# Patient Record
Sex: Male | Born: 1960 | Race: White | Hispanic: No | Marital: Married | Smoking: Never smoker
Health system: Southern US, Community
[De-identification: ages and names within clinical notes are randomized; demographics above are authoritative.]

## PROBLEM LIST (undated history)

## (undated) DIAGNOSIS — Z8619 Personal history of other infectious and parasitic diseases: Secondary | ICD-10-CM

## (undated) DIAGNOSIS — Z87442 Personal history of urinary calculi: Secondary | ICD-10-CM

## (undated) HISTORY — DX: Personal history of urinary calculi: Z87.442

## (undated) HISTORY — DX: Personal history of other infectious and parasitic diseases: Z86.19

---

## 2016-04-03 ENCOUNTER — Ambulatory Visit (INDEPENDENT_AMBULATORY_CARE_PROVIDER_SITE_OTHER): Payer: 59 | Admitting: Primary Care

## 2016-04-03 ENCOUNTER — Encounter: Payer: Self-pay | Admitting: Primary Care

## 2016-04-03 VITALS — BP 148/82 | HR 84 | Temp 98.4°F | Ht 64.5 in | Wt 159.2 lb

## 2016-04-03 DIAGNOSIS — L729 Follicular cyst of the skin and subcutaneous tissue, unspecified: Secondary | ICD-10-CM

## 2016-04-03 DIAGNOSIS — L089 Local infection of the skin and subcutaneous tissue, unspecified: Secondary | ICD-10-CM

## 2016-04-03 MED ORDER — SULFAMETHOXAZOLE-TRIMETHOPRIM 800-160 MG PO TABS: 1.0000 | ORAL_TABLET | Freq: Two times a day (BID) | ORAL | Status: AC

## 2016-04-03 NOTE — Patient Instructions (Signed)
Start Bactrim DS (sulfamethoxazole/trimethoprim) tablets for urinary tract infection. Take 1 tablet by mouth twice daily for 10 days.  Apply warm compresses to the cyst 2-3 times daily.   Do not lance the cyst.  Please notify me if no improvement in the redness and tenderness of the cyst in 3-4 days.  Please schedule a physical with me within the next 3-6 months. You may also schedule a lab only appointment 3-4 days prior. We will discuss your lab results in detail during your physical.  It was a pleasure to meet you today! Please don't hesitate to call me with any questions. Welcome to [facility]!   Epidermal Cyst An epidermal cyst is sometimes called a sebaceous cyst, epidermal inclusion cyst, or infundibular cyst. These cysts usually contain a substance that looks "pasty" or "cheesy" and may have a bad smell. This substance is a protein called keratin. Epidermal cysts are usually found on the face, neck, or trunk. They may also occur in the vaginal area or other parts of the genitalia of both men and women. Epidermal cysts are usually small, painless, slow-growing bumps or lumps that move freely under the skin. It is important not to try to pop them. This may cause an infection and lead to tenderness and swelling. CAUSES  Epidermal cysts may be caused by a deep penetrating injury to the skin or a plugged hair follicle, often associated with acne. SYMPTOMS  Epidermal cysts can become inflamed and cause:  Redness.  Tenderness.  Increased temperature of the skin over the bumps or lumps.  Grayish-white, bad smelling material that drains from the bump or lump. DIAGNOSIS  Epidermal cysts are easily diagnosed by your caregiver during an exam. Rarely, a tissue sample (biopsy) may be taken to rule out other conditions that may resemble epidermal cysts. TREATMENT   Epidermal cysts often get better and disappear on their own. They are rarely ever cancerous.  If a cyst becomes infected, it may  become inflamed and tender. This may require opening and draining the cyst. Treatment with antibiotics may be necessary. When the infection is gone, the cyst may be removed with minor surgery.  Small, inflamed cysts can often be treated with antibiotics or by injecting steroid medicines.  Sometimes, epidermal cysts become large and bothersome. If this happens, surgical removal in your caregiver's office may be necessary. HOME CARE INSTRUCTIONS  Only take over-the-counter or prescription medicines as directed by your caregiver.  Take your antibiotics as directed. Finish them even if you start to feel better. SEEK MEDICAL CARE IF:   Your cyst becomes tender, red, or swollen.  Your condition is not improving or is getting worse.  You have any other questions or concerns. MAKE SURE YOU:  Understand these instructions.  Will watch your condition.  Will get help right away if you are not doing well or get worse.   This information is not intended to replace advice given to you by your health care provider. Make sure you discuss any questions you have with your health care provider.   Document Released: 09/20/2004 Document Revised: 01/12/2012 Document Reviewed: 04/28/2011 Elsevier Interactive Patient Education [employer]

## 2016-04-03 NOTE — Progress Notes (Signed)
Pre visit review using our clinic review tool, if applicable. No additional management support is needed unless otherwise documented below in the visit note. 

## 2016-04-03 NOTE — Progress Notes (Signed)
Subjective:    Patient ID: Zachary Christensen, male    DOB: [birth year], 55 y.o.   MRN: [id number]  HPI  Mr. Zachary Christensen is a 55 year old male who presents today to establish care and discuss the problems mentioned below. Will obtain old records. His last physical was 5-6 years ago.   1) Skin Mass: Located to right upper portion of the lateral neck. He initially noticed the mass 1 year ago which was non tender, skin toned, and developed to the size of a "pea". This mass dissipated after several months.    Around January/February 2017 he noticed the mass resurface. Since it started to resurface it's gradually grown in size. He reports tenderness, erythema, and mild itching that has been present over the past several days. Denies fevers, chills, weakness.  Review of Systems  Constitutional: Negative for fever and fatigue.  HENT: Negative for congestion, rhinorrhea and sore throat.   Respiratory: Negative for shortness of breath.   Cardiovascular: Negative for chest pain.  Musculoskeletal: Negative for myalgias and arthralgias.  Skin: Positive for color change.       Skin mass  Allergic/Immunologic: Negative for environmental allergies.  Neurological: Negative for dizziness, weakness, numbness and headaches.       Past Medical History  Diagnosis Date  . History of chickenpox   . History of kidney stones      Social History   Social History  . Marital Status: Married    Spouse Name: N/A  . Number of Children: N/A  . Years of Education: N/A   Occupational History  . Not on file.   Social History Main Topics  . Smoking status: Never Smoker   . Smokeless tobacco: Not on file  . Alcohol Use: No  . Drug Use: No  . Sexual Activity: Not on file   Other Topics Concern  . Not on file   Social History Narrative   Married.   Works as an Environmental manager.   3 daughters.   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    No past  surgical history on file.  Family History  Problem Relation Age of Onset  . Heart disease Father   . Arthritis Mother     No Known Allergies  No current outpatient prescriptions on file prior to visit.   No current facility-administered medications on file prior to visit.    BP 148/82 mmHg  Pulse 84  Temp(Src) 98.4 F (36.9 C) (Oral)  Ht 5' 4.5" (1.638 m)  Wt 159 lb 4 oz (72.235 kg)  BMI 26.92 kg/m2  SpO2 97%    Objective:   Physical Exam  Constitutional: He is oriented to person, place, and time. He appears well-nourished.  Neck: Neck supple.  Cardiovascular: Normal rate and regular rhythm.   Pulmonary/Chest: Effort normal and breath sounds normal. He has no wheezes. He has no rales.  Neurological: He is alert and oriented to person, place, and time.  Skin: Skin is warm and dry. There is erythema.  2 cm in diameter, raised, firm, immobile, erythematous mass to right upper neck line just distal to right ear. Non fluctuant.  Psychiatric: He has a normal mood and affect.          Assessment & Plan:  Infected Cyst:  Mass that represents an epidermal cyst to right lateral neck line. Does not represent an abscess, however, the cyst does appear infected. Discussed etiology of various cysts and that  they will often change in size. Will initiate antibiotic treatment today as this one does appear acutely infected. Rx for bactrim DS tablets sent to pharmacy. Warm compresses, ibuprofen PRN pain.  Patient and wife to call if no reduction in erythema, tenderness, size.

## 2016-04-11 ENCOUNTER — Telehealth: Payer: Self-pay

## 2016-04-11 ENCOUNTER — Other Ambulatory Visit: Payer: Self-pay | Admitting: Primary Care

## 2016-04-11 DIAGNOSIS — R221 Localized swelling, mass and lump, neck: Secondary | ICD-10-CM

## 2016-04-11 NOTE — Telephone Encounter (Signed)
Wilhelmenia Blase (DPR signed) left v/m that pt was seen 04/03/16 and cyst on neck is not hurting now but there is no other improvement; cyst is large and wants to know if needs to be seen or can abx be changed. pt taking abx bid. Wilhelmenia Blase request cb.

## 2016-04-11 NOTE — Telephone Encounter (Signed)
I think we need to proceed with an ultrasound to get a better idea of what's going on. If he is agreeable, then I will order. Someone will be in tough with them soon in regards to scheduling if they chose to move forward.

## 2016-04-11 NOTE — Telephone Encounter (Signed)
Spoke with Martie Lee, she said that Pt is agreeable to proceed with an [place].

## 2016-04-17 ENCOUNTER — Other Ambulatory Visit: Payer: Self-pay | Admitting: Primary Care

## 2016-04-17 ENCOUNTER — Ambulatory Visit
Admission: RE | Admit: 2016-04-17 | Discharge: 2016-04-17 | Disposition: A | Discharge status: Home / Self Care | Payer: 59 | Source: Ambulatory Visit | Attending: Primary Care | Admitting: Primary Care

## 2016-04-17 DIAGNOSIS — R221 Localized swelling, mass and lump, neck: Secondary | ICD-10-CM

## 2018-08-21 ENCOUNTER — Emergency Department (HOSPITAL_COMMUNITY): Admit: 2018-08-21 | Payer: Self-pay | Admitting: Cardiovascular Disease

## 2018-08-21 ENCOUNTER — Inpatient Hospital Stay (HOSPITAL_COMMUNITY): Payer: Self-pay

## 2018-08-21 ENCOUNTER — Inpatient Hospital Stay (HOSPITAL_COMMUNITY): Admission: EM | Disposition: E | Payer: Self-pay | Source: Home / Self Care | Attending: Critical Care Medicine

## 2018-08-21 ENCOUNTER — Emergency Department (HOSPITAL_COMMUNITY): Payer: Self-pay

## 2018-08-21 ENCOUNTER — Other Ambulatory Visit: Payer: Self-pay

## 2018-08-21 ENCOUNTER — Encounter (HOSPITAL_COMMUNITY): Payer: Self-pay | Admitting: Anesthesiology

## 2018-08-21 ENCOUNTER — Encounter (HOSPITAL_COMMUNITY): Payer: Self-pay

## 2018-08-21 DIAGNOSIS — G931 Anoxic brain damage, not elsewhere classified: Secondary | ICD-10-CM | POA: Diagnosis present

## 2018-08-21 DIAGNOSIS — N179 Acute kidney failure, unspecified: Secondary | ICD-10-CM | POA: Diagnosis present

## 2018-08-21 DIAGNOSIS — I2119 ST elevation (STEMI) myocardial infarction involving other coronary artery of inferior wall: Principal | ICD-10-CM | POA: Diagnosis present

## 2018-08-21 DIAGNOSIS — I4901 Ventricular fibrillation: Secondary | ICD-10-CM | POA: Diagnosis present

## 2018-08-21 DIAGNOSIS — I462 Cardiac arrest due to underlying cardiac condition: Secondary | ICD-10-CM | POA: Diagnosis present

## 2018-08-21 DIAGNOSIS — Z8619 Personal history of other infectious and parasitic diseases: Secondary | ICD-10-CM

## 2018-08-21 DIAGNOSIS — T884XXA Failed or difficult intubation, initial encounter: Secondary | ICD-10-CM | POA: Diagnosis present

## 2018-08-21 DIAGNOSIS — Z66 Do not resuscitate: Secondary | ICD-10-CM | POA: Diagnosis not present

## 2018-08-21 DIAGNOSIS — E872 Acidosis: Secondary | ICD-10-CM | POA: Diagnosis present

## 2018-08-21 DIAGNOSIS — J9601 Acute respiratory failure with hypoxia: Secondary | ICD-10-CM | POA: Diagnosis present

## 2018-08-21 DIAGNOSIS — I469 Cardiac arrest, cause unspecified: Secondary | ICD-10-CM

## 2018-08-21 DIAGNOSIS — I501 Left ventricular failure: Secondary | ICD-10-CM

## 2018-08-21 DIAGNOSIS — R570 Cardiogenic shock: Secondary | ICD-10-CM

## 2018-08-21 DIAGNOSIS — Z87442 Personal history of urinary calculi: Secondary | ICD-10-CM

## 2018-08-21 DIAGNOSIS — J811 Chronic pulmonary edema: Secondary | ICD-10-CM | POA: Diagnosis present

## 2018-08-21 DIAGNOSIS — I213 ST elevation (STEMI) myocardial infarction of unspecified site: Secondary | ICD-10-CM

## 2018-08-21 DIAGNOSIS — J9602 Acute respiratory failure with hypercapnia: Secondary | ICD-10-CM

## 2018-08-21 DIAGNOSIS — Z8249 Family history of ischemic heart disease and other diseases of the circulatory system: Secondary | ICD-10-CM

## 2018-08-21 DIAGNOSIS — I493 Ventricular premature depolarization: Secondary | ICD-10-CM | POA: Diagnosis present

## 2018-08-21 DIAGNOSIS — R9431 Abnormal electrocardiogram [ECG] [EKG]: Secondary | ICD-10-CM

## 2018-08-21 LAB — I-STAT ARTERIAL BLOOD GAS, ED
ACID-BASE DEFICIT: 14 mmol/L — AB (ref 0.0–2.0)
Acid-base deficit: 7 mmol/L — ABNORMAL HIGH (ref 0.0–2.0)
Bicarbonate: 15.4 mmol/L — ABNORMAL LOW (ref 20.0–28.0)
Bicarbonate: 25.4 mmol/L (ref 20.0–28.0)
O2 SAT: 80 %
O2 SAT: 64 %
PCO2 ART: 80.3 mmHg — AB (ref 32.0–48.0)
PH ART: 7.108 — AB (ref 7.350–7.450)
PO2 ART: 35 mmHg — AB (ref 83.0–108.0)
Patient temperature: 85.6
Patient temperature: 36.8
TCO2: 17 mmol/L — ABNORMAL LOW (ref 22–32)
TCO2: 28 mmol/L (ref 22–32)
pCO2 arterial: 34.7 mmHg (ref 32.0–48.0)
pH, Arterial: 7.21 — ABNORMAL LOW (ref 7.350–7.450)
pO2, Arterial: 46 mmHg — ABNORMAL LOW (ref 83.0–108.0)

## 2018-08-21 LAB — COMPREHENSIVE METABOLIC PANEL
ALBUMIN: 3.4 g/dL — AB (ref 3.5–5.0)
ALT: 145 U/L — ABNORMAL HIGH (ref 0–44)
ANION GAP: 20 — AB (ref 5–15)
AST: 175 U/L — ABNORMAL HIGH (ref 15–41)
Alkaline Phosphatase: 69 U/L (ref 38–126)
BILIRUBIN TOTAL: 0.7 mg/dL (ref 0.3–1.2)
BUN: 16 mg/dL (ref 6–20)
CALCIUM: 9.2 mg/dL (ref 8.9–10.3)
CO2: 16 mmol/L — ABNORMAL LOW (ref 22–32)
Chloride: 102 mmol/L (ref 98–111)
Creatinine, Ser: 1.77 mg/dL — ABNORMAL HIGH (ref 0.61–1.24)
GFR calc Af Amer: 47 mL/min — ABNORMAL LOW (ref >60)
GFR, EST NON AFRICAN AMERICAN: 41 mL/min — AB (ref >60)
GLUCOSE: 275 mg/dL — AB (ref 70–99)
Potassium: 3.6 mmol/L (ref 3.5–5.1)
Sodium: 138 mmol/L (ref 135–145)
TOTAL PROTEIN: 6.7 g/dL (ref 6.5–8.1)

## 2018-08-21 LAB — CBC WITH DIFFERENTIAL/PLATELET
Abs Immature Granulocytes: 0.41 10*3/uL — ABNORMAL HIGH (ref 0.00–0.07)
BASOS ABS: 0.2 10*3/uL — AB (ref 0.0–0.1)
BASOS PCT: 1 %
EOS ABS: 0.5 10*3/uL (ref 0.0–0.5)
EOS PCT: 2 %
HCT: 54.2 % — ABNORMAL HIGH (ref 39.0–52.0)
HEMOGLOBIN: 17.4 g/dL — AB (ref 13.0–17.0)
Immature Granulocytes: 2 %
Lymphocytes Relative: 32 %
Lymphs Abs: 6.5 10*3/uL — ABNORMAL HIGH (ref 0.7–4.0)
MCH: 28.6 pg (ref 26.0–34.0)
MCHC: 32.1 g/dL (ref 30.0–36.0)
MCV: 89.1 fL (ref 80.0–100.0)
MONO ABS: 1.5 10*3/uL — AB (ref 0.1–1.0)
Monocytes Relative: 8 %
NRBC: 0 % (ref 0.0–0.2)
Neutro Abs: 11.3 10*3/uL — ABNORMAL HIGH (ref 1.7–7.7)
Neutrophils Relative %: 55 %
PLATELETS: 374 10*3/uL (ref 150–400)
RBC: 6.08 MIL/uL — AB (ref 4.22–5.81)
RDW: 12.2 % (ref 11.5–15.5)
WBC: 20.3 10*3/uL — AB (ref 4.0–10.5)

## 2018-08-21 LAB — I-STAT CHEM 8, ED
BUN: 20 mg/dL (ref 6–20)
CHLORIDE: 103 mmol/L (ref 98–111)
Calcium, Ion: 1.1 mmol/L — ABNORMAL LOW (ref 1.15–1.40)
Creatinine, Ser: 1.6 mg/dL — ABNORMAL HIGH (ref 0.61–1.24)
Glucose, Bld: 272 mg/dL — ABNORMAL HIGH (ref 70–99)
HCT: 53 % — ABNORMAL HIGH (ref 39.0–52.0)
Hemoglobin: 18 g/dL — ABNORMAL HIGH (ref 13.0–17.0)
POTASSIUM: 3.4 mmol/L — AB (ref 3.5–5.1)
SODIUM: 138 mmol/L (ref 135–145)
TCO2: 19 mmol/L — ABNORMAL LOW (ref 22–32)

## 2018-08-21 LAB — I-STAT TROPONIN, ED: Troponin i, poc: 0.76 ng/mL (ref 0.00–0.08)

## 2018-08-21 LAB — LIPID PANEL
Cholesterol: 206 mg/dL — ABNORMAL HIGH (ref 0–200)
HDL: 48 mg/dL (ref >40)
LDL CALC: 136 mg/dL — AB (ref 0–99)
TRIGLYCERIDES: 112 mg/dL (ref <150)
Total CHOL/HDL Ratio: 4.3 RATIO
VLDL: 22 mg/dL (ref 0–40)

## 2018-08-21 LAB — I-STAT CG4 LACTIC ACID, ED: LACTIC ACID, VENOUS: 9.49 mmol/L — AB (ref 0.5–1.9)

## 2018-08-21 LAB — TROPONIN I
TROPONIN I: 1.19 ng/mL — AB (ref <0.03)
Troponin I: 8.46 ng/mL (ref <0.03)

## 2018-08-21 LAB — PROTIME-INR
INR: 1.07
PROTHROMBIN TIME: 13.8 s (ref 11.4–15.2)

## 2018-08-21 LAB — LACTIC ACID, PLASMA: LACTIC ACID, VENOUS: 8.7 mmol/L — AB (ref 0.5–1.9)

## 2018-08-21 LAB — APTT: aPTT: 30 seconds (ref 24–36)

## 2018-08-21 SURGERY — CORONARY/GRAFT ACUTE MI REVASCULARIZATION
Anesthesia: LOCAL

## 2018-08-21 MED ORDER — EPINEPHRINE PF 1 MG/10ML IJ SOSY
PREFILLED_SYRINGE | INTRAMUSCULAR | Status: AC | PRN
  Administered 2018-08-21 (×3): 1 via INTRAVENOUS

## 2018-08-21 MED ORDER — SUCCINYLCHOLINE CHLORIDE 20 MG/ML IJ SOLN
INTRAMUSCULAR | Status: AC | PRN
  Administered 2018-08-21: 150 mg via INTRAVENOUS

## 2018-08-21 MED ORDER — VERAPAMIL HCL 2.5 MG/ML IV SOLN
INTRAVENOUS | Status: AC
  Filled 2018-08-21: qty 2

## 2018-08-21 MED ORDER — ETOMIDATE 2 MG/ML IV SOLN
INTRAVENOUS | Status: AC | PRN
  Administered 2018-08-21: 20 mg via INTRAVENOUS

## 2018-08-21 MED ORDER — INSULIN ASPART 100 UNIT/ML ~~LOC~~ SOLN: 2.0000 [IU] | SUBCUTANEOUS | Status: DC

## 2018-08-21 MED ORDER — HEPARIN (PORCINE) IN NACL 100-0.45 UNIT/ML-% IJ SOLN
900.0000 [IU]/h | INTRAMUSCULAR | Status: DC
  Administered 2018-08-21: 900 [IU]/h via INTRAVENOUS
  Filled 2018-08-21: qty 250

## 2018-08-21 MED ORDER — NOREPINEPHRINE 4 MG/250ML-% IV SOLN
0.0000 ug/min | INTRAVENOUS | Status: DC
  Administered 2018-08-21: 20 ug/min via INTRAVENOUS

## 2018-08-21 MED ORDER — LIDOCAINE HCL (PF) 1 % IJ SOLN
INTRAMUSCULAR | Status: AC
  Filled 2018-08-21: qty 30

## 2018-08-21 MED ORDER — VECURONIUM BROMIDE 10 MG IV SOLR
INTRAVENOUS | Status: AC | PRN
  Administered 2018-08-21: 10 mg via INTRAVENOUS

## 2018-08-21 MED ORDER — HEPARIN (PORCINE) IN NACL 1000-0.9 UT/500ML-% IV SOLN
INTRAVENOUS | Status: AC
  Filled 2018-08-21: qty 500

## 2018-08-21 MED ORDER — SODIUM BICARBONATE 8.4 % IV SOLN
INTRAVENOUS | Status: AC | PRN
  Administered 2018-08-21 (×4): 50 meq via INTRAVENOUS

## 2018-08-21 MED ORDER — NITROGLYCERIN 1 MG/10 ML FOR IR/CATH LAB
INTRA_ARTERIAL | Status: AC
  Filled 2018-08-21: qty 10

## 2018-08-21 MED ORDER — HEPARIN BOLUS VIA INFUSION
3500.0000 [IU] | Freq: Once | INTRAVENOUS | Status: AC
  Administered 2018-08-21: 3500 [IU] via INTRAVENOUS
  Filled 2018-08-21: qty 3500

## 2018-08-21 MED ORDER — SODIUM BICARBONATE 8.4 % IV SOLN
100.0000 meq | Freq: Once | INTRAVENOUS | Status: AC
  Administered 2018-08-21: 100 meq via INTRAVENOUS
  Filled 2018-08-21: qty 100

## 2018-08-21 MED ORDER — MAGNESIUM SULFATE 2 GM/50ML IV SOLN
2.0000 g | Freq: Once | INTRAVENOUS | Status: AC
  Administered 2018-08-21: 2 g via INTRAVENOUS
  Filled 2018-08-21: qty 50

## 2018-08-21 MED ORDER — FENTANYL CITRATE (PF) 100 MCG/2ML IJ SOLN: 50.0000 ug | INTRAMUSCULAR | Status: DC | PRN

## 2018-08-21 MED ORDER — FENTANYL CITRATE (PF) 100 MCG/2ML IJ SOLN
50.0000 ug | INTRAMUSCULAR | Status: DC | PRN
  Administered 2018-08-21: 50 ug via INTRAVENOUS

## 2018-08-21 MED ORDER — FENTANYL CITRATE (PF) 100 MCG/2ML IJ SOLN
INTRAMUSCULAR | Status: AC
  Filled 2018-08-21: qty 2

## 2018-08-21 MED ORDER — DEXTROSE 5 % IV SOLN
INTRAVENOUS | Status: AC | PRN
  Administered 2018-08-21: 150 mg via INTRAVENOUS

## 2018-08-21 MED ORDER — VECURONIUM BROMIDE 10 MG IV SOLR
INTRAVENOUS | Status: AC
  Filled 2018-08-21: qty 10

## 2018-08-21 MED ORDER — PANTOPRAZOLE SODIUM 40 MG PO PACK: 40.0000 mg | PACK | Freq: Every day | ORAL | Status: DC

## 2018-08-21 MED ORDER — HEPARIN (PORCINE) IN NACL 1000-0.9 UT/500ML-% IV SOLN
INTRAVENOUS | Status: AC
  Filled 2018-08-21: qty 1000

## 2018-08-21 MED ORDER — PIPERACILLIN-TAZOBACTAM 3.375 G IVPB 30 MIN
3.3750 g | Freq: Once | INTRAVENOUS | Status: AC
  Administered 2018-08-21: 3.375 g via INTRAVENOUS
  Filled 2018-08-21: qty 50

## 2018-08-21 MED ORDER — SODIUM CHLORIDE 0.9 % IV SOLN: INTRAVENOUS | Status: DC | PRN

## 2018-08-21 MED ORDER — HEPARIN SODIUM (PORCINE) 5000 UNIT/ML IJ SOLN: 5000.0000 [IU] | Freq: Three times a day (TID) | INTRAMUSCULAR | Status: DC

## 2018-08-21 MED ORDER — POTASSIUM CHLORIDE 10 MEQ/100ML IV SOLN
10.0000 meq | INTRAVENOUS | Status: AC
  Administered 2018-08-21 (×2): 10 meq via INTRAVENOUS
  Filled 2018-08-21 (×2): qty 100

## 2018-08-21 MED FILL — Medication: Qty: 1 | Status: AC

## 2018-08-22 LAB — BLOOD CULTURE ID PANEL (REFLEXED)
ACINETOBACTER BAUMANNII: NOT DETECTED
CANDIDA ALBICANS: NOT DETECTED
CANDIDA GLABRATA: NOT DETECTED
CANDIDA KRUSEI: NOT DETECTED
CANDIDA TROPICALIS: NOT DETECTED
Candida parapsilosis: NOT DETECTED
ENTEROBACTERIACEAE SPECIES: NOT DETECTED
ESCHERICHIA COLI: NOT DETECTED
Enterobacter cloacae complex: NOT DETECTED
Enterococcus species: NOT DETECTED
Haemophilus influenzae: NOT DETECTED
KLEBSIELLA OXYTOCA: NOT DETECTED
KLEBSIELLA PNEUMONIAE: NOT DETECTED
Listeria monocytogenes: NOT DETECTED
Neisseria meningitidis: NOT DETECTED
Proteus species: NOT DETECTED
Pseudomonas aeruginosa: NOT DETECTED
STREPTOCOCCUS PYOGENES: NOT DETECTED
Serratia marcescens: NOT DETECTED
Staphylococcus aureus (BCID): NOT DETECTED
Staphylococcus species: NOT DETECTED
Streptococcus agalactiae: NOT DETECTED
Streptococcus pneumoniae: NOT DETECTED
Streptococcus species: DETECTED — AB

## 2018-08-23 ENCOUNTER — Telehealth: Payer: Self-pay

## 2018-08-23 NOTE — Telephone Encounter (Signed)
On 08/23/18 I received a d/c from [facility] (original). D/C is for cremation. D/C will be taken to Pulmonary Unit for signature.  On 08/24/18 I received the d/c back from Doctor Craige Cotta,  I got the d/c ready and called the funeral home to let them know the d/c is ready for pickup.  I also faxed a copy to the funeral home per the funeral home request.

## 2018-08-24 LAB — CULTURE, BLOOD (ROUTINE X 2)

## 2018-08-26 LAB — CULTURE, BLOOD (ROUTINE X 2): Culture: NO GROWTH

## 2018-09-03 NOTE — Code Documentation (Addendum)
intubation w/ 7.5 ETT tube 25@lip  by MD Delo. Per nursing staff at bedside, no color change on CO2 indi Awaiting XR confirmation.

## 2018-09-03 NOTE — Code Documentation (Signed)
Anesthesia paged STAT for difficult airway

## 2018-09-03 NOTE — ED Provider Notes (Signed)
[facility] EMERGENCY DEPARTMENT Provider Note   CSN: [id number] Arrival date & time: 2018-09-20  0155     History   Chief Complaint Chief Complaint  Patient presents with  . Post CPR/Code STEMI    HPI Zachary Christensen is a 57 y.o. male.  This patient is a 57 year old male brought from home by EMS after his wife noticed him unresponsive.  I am told she was sleeping beside him when he began to make snoring respirations and would not respond.  She was unable to wake him and EMS was called.  I am told that he was found initially to be in ventricular fibrillation and received 2 shocks prior to transport.  He also received epinephrine and CPR in route.  A King airway was placed and the patient was transported here.  He has no additional history secondary to acuity of condition.  The history is provided by the patient.    Past Medical History:  Diagnosis Date  . History of chickenpox   . History of kidney stones     Patient Active Problem List   Diagnosis Date Noted  . Cardiac arrest (HCC) 09-20-18          Home Medications    Prior to Admission medications   Medication Sig Start Date End Date Taking? Authorizing Provider  Multiple Vitamin (MULTIVITAMIN) capsule Take 1 capsule by mouth daily.    [provider]  sulfamethoxazole-trimethoprim (BACTRIM DS,SEPTRA DS) 800-160 MG tablet Take 1 tablet by mouth 2 (two) times daily. 04/03/16   Doreene Nest, NP    Family History Family History  Problem Relation Age of Onset  . Heart disease Father   . Arthritis Mother     Social History Social History   Tobacco Use  . Smoking status: Never Smoker  Substance Use Topics  . Alcohol use: No    Alcohol/week: 0.0 standard drinks  . Drug use: No     Allergies   Patient has no known allergies.   Review of Systems Review of Systems  Unable to perform ROS: Acuity of condition     Physical Exam Updated Vital Signs BP (!) 200/145    Pulse 97   Temp 99.3 F (37.4 C)   Resp (!) 30   Ht 5\' 4"  (1.626 m)   Wt 70 kg   SpO2 (!) 61%   BMI 26.49 kg/m   Physical Exam  Constitutional: He appears well-developed and well-nourished. No distress.  HENT:  Head: Normocephalic and atraumatic.  Neck: Normal range of motion. Neck supple.  Cardiovascular: Normal rate and regular rhythm. Exam reveals no friction rub.  No murmur heard. Pulmonary/Chest:  Breath sounds are coarse bilaterally.  King airway is in place and he is being ventilated through this..  Abdominal: Soft. Bowel sounds are normal. He exhibits no distension. There is no tenderness.  Musculoskeletal: Normal range of motion. He exhibits no edema.  Neurological:  GCS is 6 with no spontaneous eye movement, no verbal response, but does seem to withdraw from pain initially.  Skin: Skin is warm and dry. He is not diaphoretic.  Nursing note and vitals reviewed.    ED Treatments / Results  Labs (all labs ordered are listed, but only abnormal results are displayed) Labs Reviewed  CBC WITH DIFFERENTIAL/PLATELET - Abnormal; Notable for the following components:      Result Value   WBC 20.3 (*)    RBC 6.08 (*)    Hemoglobin 17.4 (*)  HCT 54.2 (*)    Neutro Abs 11.3 (*)    Lymphs Abs 6.5 (*)    Monocytes Absolute 1.5 (*)    Basophils Absolute 0.2 (*)    Abs Immature Granulocytes 0.41 (*)    All other components within normal limits  COMPREHENSIVE METABOLIC PANEL - Abnormal; Notable for the following components:   CO2 16 (*)    Glucose, Bld 275 (*)    Creatinine, Ser 1.77 (*)    Albumin 3.4 (*)    AST 175 (*)    ALT 145 (*)    GFR calc non Af Amer 41 (*)    GFR calc Af Amer 47 (*)    Anion gap 20 (*)    All other components within normal limits  TROPONIN I - Abnormal; Notable for the following components:   Troponin I 1.19 (*)    All other components within normal limits  LIPID PANEL - Abnormal; Notable for the following components:   Cholesterol 206 (*)     LDL Cholesterol 136 (*)    All other components within normal limits  LACTIC ACID, PLASMA - Abnormal; Notable for the following components:   Lactic Acid, Venous 8.7 (*)    All other components within normal limits  TROPONIN I - Abnormal; Notable for the following components:   Troponin I 8.46 (*)    All other components within normal limits  I-STAT CHEM 8, ED - Abnormal; Notable for the following components:   Potassium 3.4 (*)    Creatinine, Ser 1.60 (*)    Glucose, Bld 272 (*)    Calcium, Ion 1.10 (*)    TCO2 19 (*)    Hemoglobin 18.0 (*)    HCT 53.0 (*)    All other components within normal limits  I-STAT TROPONIN, ED - Abnormal; Notable for the following components:   Troponin i, poc 0.76 (*)    All other components within normal limits  I-STAT CG4 LACTIC ACID, ED - Abnormal; Notable for the following components:   Lactic Acid, Venous 9.49 (*)    All other components within normal limits  I-STAT ARTERIAL BLOOD GAS, ED - Abnormal; Notable for the following components:   pH, Arterial 7.210 (*)    pO2, Arterial 35.0 (*)    Bicarbonate 15.4 (*)    TCO2 17 (*)    Acid-base deficit 14.0 (*)    All other components within normal limits  I-STAT ARTERIAL BLOOD GAS, ED - Abnormal; Notable for the following components:   pH, Arterial 7.108 (*)    pCO2 arterial 80.3 (*)    pO2, Arterial 46.0 (*)    Acid-base deficit 7.0 (*)    All other components within normal limits  CULTURE, BLOOD (ROUTINE X 2)  CULTURE, BLOOD (ROUTINE X 2)  CULTURE, RESPIRATORY  PROTIME-INR  APTT  URINALYSIS, ROUTINE W REFLEX MICROSCOPIC  RAPID URINE DRUG SCREEN, HOSP PERFORMED  BLOOD GAS, ARTERIAL  TROPONIN I  TROPONIN I  HEPARIN LEVEL (UNFRACTIONATED)  HIV ANTIBODY (ROUTINE TESTING W REFLEX)  HEMOGLOBIN A1C    EKG None  Radiology Dg Chest Portable 1 View  Result Date: September 09, 2018 CLINICAL DATA:  Post intubation EXAM: PORTABLE CHEST 1 VIEW COMPARISON:  None. FINDINGS: Endotracheal tube tip is  about 2.9 cm superior to the carina. Cardiomegaly with vascular congestion and mild diffuse interstitial edema. Subsegmental atelectasis at the right base. No pneumothorax. IMPRESSION: 1. Endotracheal tube tip about 2.9 cm superior to the carina 2. Cardiomegaly with vascular congestion and mild interstitial edema.  Electronically Signed   By: Jasmine Pang M.D.   On: 08/27/2018 02:51    Procedures Procedures (including critical care time)  Medications Ordered in ED Medications  norepinephrine (LEVOPHED) 4mg  in D5W premix infusion (40 mcg/min Intravenous Rate/Dose Change 08-27-2018 0456)  potassium chloride 10 mEq in 100 mL IVPB (10 mEq Intravenous New Bag/Given 2018-08-27 0501)  insulin aspart (novoLOG) injection 2-6 Units (has no administration in time range)  0.9 %  sodium chloride infusion (has no administration in time range)  heparin ADULT infusion 100 units/mL (25000 units/254mL sodium chloride 0.45%) (900 Units/hr Intravenous New Bag/Given Aug 27, 2018 0427)  pantoprazole sodium (PROTONIX) 40 mg/20 mL oral suspension 40 mg (has no administration in time range)  fentaNYL (SUBLIMAZE) injection 50 mcg (50 mcg Intravenous Given 08/27/18 0426)  fentaNYL (SUBLIMAZE) injection 50 mcg (has no administration in time range)  fentaNYL (SUBLIMAZE) 100 MCG/2ML injection (  Canceled Entry 08/27/2018 0434)  EPINEPHrine (ADRENALIN) 1 MG/10ML injection (1 Syringe Intravenous Given 2018/08/27 0456)  sodium bicarbonate injection (50 mEq Intravenous Given August 27, 2018 0500)  vecuronium (NORCURON) 10 MG injection (has no administration in time range)  vecuronium (NORCURON) injection (10 mg Intravenous Given 27-Aug-2018 0455)  etomidate (AMIDATE) injection (20 mg Intravenous Given 2018-08-27 0205)  succinylcholine (ANECTINE) injection (150 mg Intravenous Given August 27, 2018 0205)  succinylcholine (ANECTINE) injection (150 mg Intravenous Given 2018/08/27 0223)  etomidate (AMIDATE) injection (20 mg Intravenous Given 08-27-18 0223)    amiodarone (CORDARONE) 150 mg in dextrose 5 % 100 mL bolus ( Intravenous Stopped 08-27-18 0236)  sodium bicarbonate injection 100 mEq (100 mEq Intravenous Given Aug 27, 2018 0343)  magnesium sulfate IVPB 2 g 50 mL (0 g Intravenous Stopped 2018/08/27 0443)  piperacillin-tazobactam (ZOSYN) IVPB 3.375 g (0 g Intravenous Stopped 08/27/18 0502)  heparin bolus via infusion 3,500 Units (3,500 Units Intravenous Bolus from Bag 2018-08-27 0430)     Initial Impression / Assessment and Plan / ED Course  I have reviewed the triage vital signs and the nursing notes.  Pertinent labs & imaging results that were available during my care of the patient were reviewed by me and considered in my medical decision making (see chart for details).  Patient arrived here critically ill.  He received ACLS intervention as described in the HPI.  A prehospital EKG was obtained and showed a left bundle branch block.  For this reason, a code STEMI was called and the patient was transported here.  He arrived with a pulse and blood pressure, however no spontaneous eye opening, no verbal response, and withdrawing from pain.  The cardiology team was present shortly after the patient's arrival.  The plan initially was to take him to the Cath Lab, however there were difficulties obtaining an airway.  After RSI using etomidate and succinylcholine, the glide scope was used, but cords were not visualized.  I was able to visualize the epiglottis, however was unable to see the vocal cords..  An attempted intubation was made, however eventually proved to be esophageal when he began to desaturate.  The tube was removed and he was ventilated by bag-valve-mask.  Anesthesia was notified who came to the patient's room and was ultimately able to secure an airway with the use of a bougie and 6.5 endotracheal tube.  The patient was seen by cardiology and felt to not be a candidate for catheterization.  PCCM was consulted and has evaluated the patient.   He will be admitted to the critical care service with what appears to be a poor prognosis.  CRITICAL CARE  Performed by: Geoffery Lyons Total critical care time: 70 minutes Critical care time was exclusive of separately billable procedures and treating other patients. Critical care was necessary to treat or prevent imminent or life-threatening deterioration. Critical care was time spent personally by me on the following activities: development of treatment plan with patient and/or surrogate as well as nursing, discussions with consultants, evaluation of patient's response to treatment, examination of patient, obtaining history from patient or surrogate, ordering and performing treatments and interventions, ordering and review of laboratory studies, ordering and review of radiographic studies, pulse oximetry and re-evaluation of patient's condition.   Final Clinical Impressions(s) / ED Diagnoses   Final diagnoses:  STEMI (ST elevation myocardial infarction) (HCC)  AKI (acute kidney injury) [facility])    ED Discharge Orders    None       Geoffery Lyons, MD September 01, 2018 [phone]

## 2018-09-03 NOTE — ED Triage Notes (Signed)
Pt BIB [facility] for eval s/p cardiac arrest. Wife awoke and noted that pt was "not breathing normally" and he was unresponsive. Activated 911 @0046 , pt was pulseless and apneic on PD/Fire arrival. BLS/CPR initiated. On EMS arrival, pt was noted to be in Vfib-3 shocks, 2 EPI, converted to [place] w/ subsequent conversion to NSR. Pt arrives w/ NSR w/ significant ectopy, bigeminy. King airway in place.

## 2018-09-03 NOTE — Progress Notes (Signed)
Interventional Cardiology Note: Pt evaluated in the trauma bay of the ED. Out of hospital VF arrest as outlined by Dr Clarnce Flock. Pt with severe global LV dysfunction by bedside echo, lactate 10, and signs of posturing on exam. EKG shows LBBB. He had a very difficult airway with prolonged hypoxemia. I do not think he would benefit from going to the cath lab emergently. Discussed plan for CCM evaluation, likely therapeutic hypothermia, and supportive therapy. Unfortunately I think he has a poor prognosis based on arrest scenario. Will follow. Otherwise per note of Dr Clarnce Flock.  Tonny Bollman Aug 31, 2018 3:14 AM

## 2018-09-03 NOTE — Consult Note (Signed)
Cardiology Consult    Patient ID: Zachary Christensen MRN: [id number], DOB/AGE: 1961/10/14   Admit date: August 24, 2018 Date of Consult: 08-24-2018  Primary Physician: Doreene Nest, NP Primary Cardiologist: No primary care provider on file.  Patient Profile    Zachary Christensen is a 57 y.o. male without significant past medical history.  He presents to [facility] as a cardiac arrest.  His wife was in bed with him awake when she heard husband not breathing heavy.  She noted that he was unconscious.  He was pulled off the bed and CPR was initiated by her and the family members.  It is unclear how long it took for the EMS to arrive but was likely more than 10 min. Presenting rhythm was VF. 3 shocks and 2 epi. 10 min of CPR by EMS. He had one intubation attempt hat was unsuccessful then they preceded with [facility] airway.   On arrival to ED he was HDS in Sinus tach with frequent PVC. His King airway was swapped for an ETT. Intubation was complicated by a very difficult airway. Prolonged hypoxia. TTE with severe LV dysfunction. Patient posturing on exam.  Lactic acidosis, 0.7 trop.   Per wife there were absolutely no symptoms of CP or SOB preceding this event.   Past Medical History   Past Medical History:  Diagnosis Date  . History of chickenpox   . History of kidney stones        Allergies  No Known Allergies   Inpatient Medications      Family History    Family History  Problem Relation Age of Onset  . Heart disease Father   . Arthritis Mother    has no family status information on file.    Social History    Social History   Socioeconomic History  . Marital status: Married    Spouse name: Not on file  . Number of children: Not on file  . Years of education: Not on file  . Highest education level: Not on file  Occupational History  . Not on file  Social Needs  . Financial resource strain: Not on file  . Food insecurity:    Worry: Not on file    Inability: Not on  file  . Transportation needs:    Medical: Not on file    Non-medical: Not on file  Tobacco Use  . Smoking status: Never Smoker  Substance and Sexual Activity  . Alcohol use: No    Alcohol/week: 0.0 standard drinks  . Drug use: No  . Sexual activity: Not on file  Lifestyle  . Physical activity:    Days per week: Not on file    Minutes per session: Not on file  . Stress: Not on file  Relationships  . Social connections:    Talks on phone: Not on file    Gets together: Not on file    Attends religious service: Not on file    Active member of club or organization: Not on file    Attends meetings of clubs or organizations: Not on file    Relationship status: Not on file  . Intimate partner violence:    Fear of current or ex partner: Not on file    Emotionally abused: Not on file    Physically abused: Not on file    Forced sexual activity: Not on file  Other Topics Concern  . Not on file  Social History Narrative   Married.  Works as an Environmental manager.   3 daughters.   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     Review of Systems    General:  No chills, fever, night sweats or weight changes.  Cardiovascular:  No chest pain, dyspnea on exertion, edema, orthopnea, palpitations, paroxysmal nocturnal dyspnea. Dermatological: No rash, lesions/masses Respiratory: No cough, dyspnea Urologic: No hematuria, dysuria Abdominal:   No nausea, vomiting, diarrhea, bright red blood per rectum, melena, or hematemesis Neurologic:  No visual changes, wkns, changes in mental status. All other systems reviewed and are otherwise negative except as noted above.  Physical Exam    Blood pressure (!) 77/67, pulse 100, resp. rate 20, SpO2 (!) 74 %.  General: Intub  Psych: can not be assessed Neuro: Posturing HEENT: ETT in place  Neck: Supple without bruits or JVD. Lungs:  Mech breath sounds Heart: RRR  Abdomen: Soft, non-tender,  non-distended, BS + x 4.  Extremities: Cold, 1+ pulse.  Labs    Troponin [facility] Test) Recent Labs    08-27-2018 0205  TROPIPOC 0.76*   No results for input(s): CKTOTAL, CKMB, TROPONINI in the last 72 hours. Lab Results  Component Value Date   HGB 18.0 (H) 2018/08/27   HCT 53.0 (H) 08/27/18    Recent Labs  Lab 2018/08/27 0213  NA 138  K 3.4*  CL 103  BUN 20  CREATININE 1.60*  GLUCOSE 272*   No results found for: CHOL, HDL, LDLCALC, TRIG No results found for: [facility]   Radiology Studies    Dg Chest Portable 1 View  Result Date: 08-27-2018 CLINICAL DATA:  Post intubation EXAM: PORTABLE CHEST 1 VIEW COMPARISON:  None. FINDINGS: Endotracheal tube tip is about 2.9 cm superior to the carina. Cardiomegaly with vascular congestion and mild diffuse interstitial edema. Subsegmental atelectasis at the right base. No pneumothorax. IMPRESSION: 1. Endotracheal tube tip about 2.9 cm superior to the carina 2. Cardiomegaly with vascular congestion and mild interstitial edema. Electronically Signed   By: Jasmine Pang M.D.   On: 2018-08-27 02:51    ECG & Cardiac Imaging    Sinus rhythm with LBBB  Assessment & Plan    Cardiac arrest: Possibly ischemic in nature. Presenting rhythm was Vfib without preceding symptoms. Prolonged down time. Prolonged hypoxia. Evidence of severe neurological damage on exam. Evidence of severe LV dysfunction. The prognosis is extremely limited.   At this time there will be unlikely any benefit from taking the patient to the cath lab as this will unlikely change his prognosis.   Will follow along  Signed, Anicka Stuckert,MD  27-Aug-2018, 2:55 AM  For questions or updates, please contact   Please consult [email or web address] for contact info under Cardiology/STEMI.

## 2018-09-03 NOTE — Code Documentation (Signed)
Sats of 25%, ETT appears to be in malposition. Tube Removed, BVM assisted

## 2018-09-03 NOTE — Code Documentation (Signed)
MD Delo at bedside, attempting to exchange King airway by EMS for ETT tube. RT at bedside to assist.

## 2018-09-03 NOTE — Code Documentation (Signed)
Unsuccessful Intubation attempt, bagging pt up and Delo will reattempt

## 2018-09-03 NOTE — ED Notes (Signed)
Pt noted to be in Asystole on monitor w/ unobtainable art line BP. No spontaneous respirations. Auscultated apex for full minute w/ no cardiac contractions audible. Monitor demonstrates. Asystole. Will Page critical care to alert. Tentative TOD [id number].

## 2018-09-03 NOTE — Code Documentation (Signed)
Anesthesia arrived at bedside to respond for difficult airway

## 2018-09-03 NOTE — Progress Notes (Signed)
   September 12, 2018 0500  Clinical Encounter Type  Visited With Patient and family together  Visit Type Follow-up;Spiritual support  Referral From Nurse  Spiritual Encounters  Spiritual Needs Grief support  Responded to page for return visit with family because patient was actively dying. Went in to support family and per nurse indicated that family want privacy as their love one departs. Provided brief grief support and I prayed silently for their comfort.. Provided spiritual support and the ministry of presence.

## 2018-09-03 NOTE — ED Notes (Signed)
Paged critical care to RN Annie  

## 2018-09-03 NOTE — Code Documentation (Signed)
MD Hammonds at bedside, pt noted to have change in art line pressure from 170 systolic to 80 systolic over approx 2-3 minutes s/p fentanyl admin. Femoral pulses were not palpable by both this RN and MD Hammonds, approx 30 seconds of chest compressions were adminstered, before pt began to move hands-CPR stopped at that time. On bedside U/S by MD Hammonds, R fem artery noted to be pulsating. Pt then received injection of EPI by MD hammonds, see code narrator for med admin details

## 2018-09-03 NOTE — Code Documentation (Addendum)
MD Preston Fleeting at bedside to attempt intubation. Unsuccessful attempt.

## 2018-09-03 NOTE — Code Documentation (Signed)
R sided NPA placed to assist in BVM. Bloody, bilious drainage noted to R nare, suction applied.

## 2018-09-03 NOTE — ED Notes (Signed)
Paged chaplain  

## 2018-09-03 NOTE — Progress Notes (Signed)
   Sep 09, 2018 0200  Clinical Encounter Type  Visited With Family  Referral From Nurse  Spiritual Encounters  Spiritual Needs Emotional  Responded to page for CPR in progress and family in consult needing support. Provided the ministry of presence. Patient will be moved to floor.

## 2018-09-03 NOTE — Progress Notes (Signed)
ANTICOAGULATION CONSULT NOTE - Initial Consult  Pharmacy Consult for heparin, zosyn Indication: chest pain/ACS, asp PNA  No Known Allergies  Patient Measurements: Height: 5\' 4"  (162.6 cm) Weight: 154 lb 5.2 oz (70 kg) IBW/kg (Calculated) : 59.2 Heparin Dosing Weight: 70 kg  Vital Signs: BP: 122/62 (10/19 0320) Pulse Rate: 97 (10/19 0306)  Labs: Recent Labs    08/31/2018 0209 August 31, 2018 0213  HGB 17.4* 18.0*  HCT 54.2* 53.0*  PLT 374  --   APTT 30  --   LABPROT 13.8  --   INR 1.07  --   CREATININE 1.77* 1.60*  TROPONINI 1.19*  --     Estimated Creatinine Clearance: 42.1 mL/min (A) (by C-G formula based on SCr of 1.6 mg/dL (H)).   Medical History: Past Medical History:  Diagnosis Date  . History of chickenpox   . History of kidney stones     Medications:  See med history  Assessment: 57 yo s/p vfib arrest to start heparin and zosyn.  He was not on anticoagulation PTA.   Goal of Therapy:  Heparin level 0.3-0.7 units/ml Monitor platelets by anticoagulation protocol: Yes    Plan:  Heparin bolus 3500 units and drip at 900 units/hr Check heparin level 6 hours after start Daily HL and CBC while on heparin Monitor for bleeding complications Zosyn 3.375 gm IV q8 hours F/u cultures, clinical course  Alyrica Thurow Poteet August 31, 2018,3:51 AM

## 2018-09-03 NOTE — ED Notes (Signed)
Decision made at this time to limit resuscitation efforts by wife and daughter. Will continue all drips as is, however will not initiate any new ACLS medications.

## 2018-09-03 NOTE — Code Documentation (Addendum)
Anesthesia MD Rose attempting airway.

## 2018-09-03 NOTE — H&P (Signed)
NAME:  Zachary Christensen, MRN:  [id number], DOB:  1961-02-05, LOS: 0 ADMISSION DATE:  08/26/2018, CONSULTATION DATE:  08-26-18 REFERRING MD:  Dr. Judd Lien, CHIEF COMPLAINT:  Hypoxia    Brief History   57 year old male presents to ED on 10/19 s/p cardiac arrest. Wife reports that she noticed his breathing was not normal when she tried to wake him up he was unresponsive and pulseless, started CPR. Estimated 10 minutes later when EMS arrived patient was pulseless/apneic CPR started. V.Fib with 3 shocks and 2 EPI. ROSC achieved after 10 minutes. On arrival to ED patient has king airway in place. Attempt to intubated was unsuccessful x 2. During this time patient remained hypoxic. Anesthesia Intubated.   LA 9.49. TEE with severe LV dysfunction. Patient posturing on exam. Cardiology consulted. PCCM asked to admit.   Past Medical History  N/A  Significant Hospital Events   10/19 > Presents to ED   Consults: date of consult/date signed off & final recs:  Cardiology 10/19 PCCM 10/19   Procedures (surgical and bedside):  ETT 10/19 (Difficult airway) >>  Significant Diagnostic Tests:  CXR 10/19 > Endotracheal tube tip is about 2.9 cm superior to the carina. Cardiomegaly with vascular congestion and mild diffuse interstitial edema. Subsegmental atelectasis at the right base. No pneumothorax.  Micro Data:  Blood 10/19 >> Urine 10/19 >> Sputum 10/19 >>   Antimicrobials:  Zosyn 10/19 >>   Subjective:    Objective   Blood pressure 122/62, pulse 97, resp. rate (!) 30, height 5\' 4"  (1.626 m), weight 70 kg, SpO2 (!) 74 %.    Vent Mode: PRVC FiO2 (%):  [100 %] 100 % Set Rate:  [20 bmp] 20 bmp Vt Set:  [550 mL] 550 mL PEEP:  [10 cmH20] 10 cmH20 Plateau Pressure:  [40 cmH20] 40 cmH20   Intake/Output Summary (Last 24 hours) at 2018-08-26 0359 Last data filed at 08-26-18 0159 Gross per 24 hour  Intake 500 ml  Output -  Net 500 ml   Filed Weights   08-26-18 0333  Weight: 70 kg     Examination: General: Adult male, on vent  HENT: ETT in place  Lungs: Exp Wheeze, non-labored  Cardiovascular: irregular, no MRG Abdomen: active bowel sounds, non-distended  Extremities: -edema  Neuro: pupils intact, does not follow commands, does not respond to physical/verbal stimulation, posturing noted  GU: foley intact   Resolved Hospital Problem list     Assessment & Plan:   Acute Hypoxic Respiratory Failure presumed secondary to Cardiogenic Shock +Aspiration Event  Plan  -Vent Support -Trend ABG/CXR -Increase PEEP to 12  -Send Sputum Culture  -VAP Bundle  -Continue Zosyn   VF Cardiac Arrest likely due to ischemic event  -Bedside ECHO with severe LV dysfunction  Cardiogenic Shock  QTC Prolongation  Plan  -Cardiology Following > Patient not a candidate at this time due to instability  -Place Aline  -Maintain MAP >65 -Continue Heparin gtt -ECHO  -Trend Troponin  -Trend EKG  Anion Gap Metabolic Acidosis with Lactic Acidosis  Acute Kidney Injury  Hypokalemia  Plan  -Trend BMP -Replace electrolytes as indicated  -Trend LA  -Renal U/S pending   Hyperglycemia  Plan  -Trend Glucose  -SSI -Hemoglobin A1C pending  Encephalopathy, suspected due to anoxic injury  Plan  -CT Head  -RASS goal 0/-1 -PRN Fentanyl   Disposition / Summary of Today's Plan 26-Aug-2018   Family discussing Goals of Care. F/U on ECHO/CT Head and recommendations from Cardiology.  Diet: NPO Pain/Anxiety/Delirium protocol  VAP protocol  DVT prophylaxis: Heparin gtt  GI prophylaxis: PPI Hyperglycemia protocol Mobility: Bedrest  Code Status: FC Family Communication: Wife, Daughter updated at bedside   Labs   CBC: Recent Labs  Lab Sep 01, 2018 0209 2018-09-01 0213  WBC 20.3*  --   NEUTROABS 11.3*  --   HGB 17.4* 18.0*  HCT 54.2* 53.0*  MCV 89.1  --   PLT 374  --     Basic Metabolic Panel: Recent Labs  Lab 09-01-18 0209 09/01/18 0213  NA 138 138  K 3.6 3.4*  CL 102  103  CO2 16*  --   GLUCOSE 275* 272*  BUN 16 20  CREATININE 1.77* 1.60*  CALCIUM 9.2  --    GFR: Estimated Creatinine Clearance: 42.1 mL/min (A) (by C-G formula based on SCr of 1.6 mg/dL (H)). Recent Labs  Lab 2018/09/01 0209 09/01/2018 0227  WBC 20.3*  --   LATICACIDVEN  --  9.49*    Liver Function Tests: Recent Labs  Lab 09/01/18 0209  AST 175*  ALT 145*  ALKPHOS 69  BILITOT 0.7  PROT 6.7  ALBUMIN 3.4*   No results for input(s): LIPASE, AMYLASE in the last 168 hours. No results for input(s): AMMONIA in the last 168 hours.  ABG    Component Value Date/Time   PHART 7.210 (L) 09-01-2018 0251   PCO2ART 34.7 Sep 01, 2018 0251   PO2ART 35.0 (LL) 09-01-2018 0251   HCO3 15.4 (L) 09/01/2018 0251   TCO2 17 (L) 09/01/18 0251   ACIDBASEDEF 14.0 (H) 09-01-18 0251   O2SAT 80.0 2018-09-01 0251     Coagulation Profile: Recent Labs  Lab 09-01-2018 0209  INR 1.07    Cardiac Enzymes: Recent Labs  Lab Sep 01, 2018 0209  TROPONINI 1.19*    HbA1C: No results found for: HGBA1C  CBG: No results for input(s): GLUCAP in the last 168 hours.  Admitting History of Present Illness.   As above   Review of Systems:   Unable to review as patient is encephalopathic   Past Medical History  He,  has a past medical history of History of chickenpox and History of kidney stones.   Surgical History   N/A    Social History   Social History   Socioeconomic History  . Marital status: Married    Spouse name: Not on file  . Number of children: Not on file  . Years of education: Not on file  . Highest education level: Not on file  Occupational History  . Not on file  Social Needs  . Financial resource strain: Not on file  . Food insecurity:    Worry: Not on file    Inability: Not on file  . Transportation needs:    Medical: Not on file    Non-medical: Not on file  Tobacco Use  . Smoking status: Never Smoker  Substance and Sexual Activity  . Alcohol use: No     Alcohol/week: 0.0 standard drinks  . Drug use: No  . Sexual activity: Not on file  Lifestyle  . Physical activity:    Days per week: Not on file    Minutes per session: Not on file  . Stress: Not on file  Relationships  . Social connections:    Talks on phone: Not on file    Gets together: Not on file    Attends religious service: Not on file    Active member of club or organization: Not on file    Attends  meetings of clubs or organizations: Not on file    Relationship status: Not on file  . Intimate partner violence:    Fear of current or ex partner: Not on file    Emotionally abused: Not on file    Physically abused: Not on file    Forced sexual activity: Not on file  Other Topics Concern  . Not on file  Social History Narrative   Married.   Works as an Environmental manager.   3 daughters.   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  ,  reports that he has never smoked. He does not have any smokeless tobacco history on file. He reports that he does not drink alcohol or use drugs.   Family History   His family history includes Arthritis in his mother; Heart disease in his father.   Allergies No Known Allergies   Home Medications  Prior to Admission medications   Medication Sig Start Date End Date Taking? Authorizing Provider  Multiple Vitamin (MULTIVITAMIN) capsule Take 1 capsule by mouth daily.    [provider]  sulfamethoxazole-trimethoprim (BACTRIM DS,SEPTRA DS) 800-160 MG tablet Take 1 tablet by mouth 2 (two) times daily. 04/03/16   Doreene Nest, NP     Critical care time: 62 minutes      Jovita Kussmaul, AGACNP-BC [facility]  Pgr: [phone]  PCCM Pgr: [phone]

## 2018-09-03 NOTE — ED Notes (Signed)
During preparation to transport to ICU, pt became increasingly unstable, BP continues to drop into 50s despite continued epi and norepi admin. Decision made to keep pt here due to instability during transport;

## 2018-09-03 NOTE — Procedures (Signed)
Arterial Catheter Insertion Procedure Note Zachary Christensen [id number] 1961-06-07  Procedure: Insertion of Arterial Catheter  Indications: Blood pressure monitoring  Procedure Details Consent: Unable to obtain consent because of emergent medical necessity. Time Out: Verified patient identification, verified procedure, site/side was marked, verified correct patient position, special equipment/implants available, medications/allergies/relevent history reviewed, required imaging and test results available.  Performed  Maximum sterile technique was used including antiseptics, cap, gloves, gown, hand hygiene, mask and sheet. Skin prep: Chlorhexidine; local anesthetic administered 20 gauge catheter was inserted into right radial artery using the Seldinger technique. ULTRASOUND GUIDANCE USED: NO Evaluation Blood flow good; BP tracing good. Complications: No apparent complications.   Adela Ports 09/06/2018

## 2018-09-03 NOTE — Code Documentation (Signed)
Ice packs applied per cardiology recs. Plan to cool and potentially cath lab depending on hemodynamic status

## 2018-09-03 NOTE — ED Notes (Signed)
Paged anesthesia

## 2018-09-03 NOTE — Code Documentation (Signed)
MD Rose intubated successfully w/ positive color change. Equal blt breath sounds. 6.5 ETT 22@lip . Pt sats responding appropriately.

## 2018-09-03 NOTE — ED Notes (Signed)
Spoke w/ NP Idolina Primer regarding families wishes of stopping drips in near future, namely levophed, and "allow him to go in comfort". Janyth Contes gave verbal order to d/c drips when family wishes. At this time, family has not notified this RN of definitive desire to stop drips. Drips remain infusing at ordered rate at this time. Will await notification from family and alert NP Eubanks to any changes.

## 2018-09-03 NOTE — Progress Notes (Signed)
RT extubated pt. As per MD order. And per protocol. RN assisted at the bedside.

## 2018-09-03 NOTE — ED Notes (Addendum)
Dr Preston Fleeting and Dr Judd Lien informed of lactic acid 9.49

## 2018-09-03 DEATH — deceased

## 2018-12-25 IMAGING — DX DG CHEST 1V PORT
1 series · 1 of 1 positions shown · non-contrast
Comparison: None.

CLINICAL DATA: Post intubation

EXAM:
PORTABLE CHEST 1 VIEW

[chest ap]
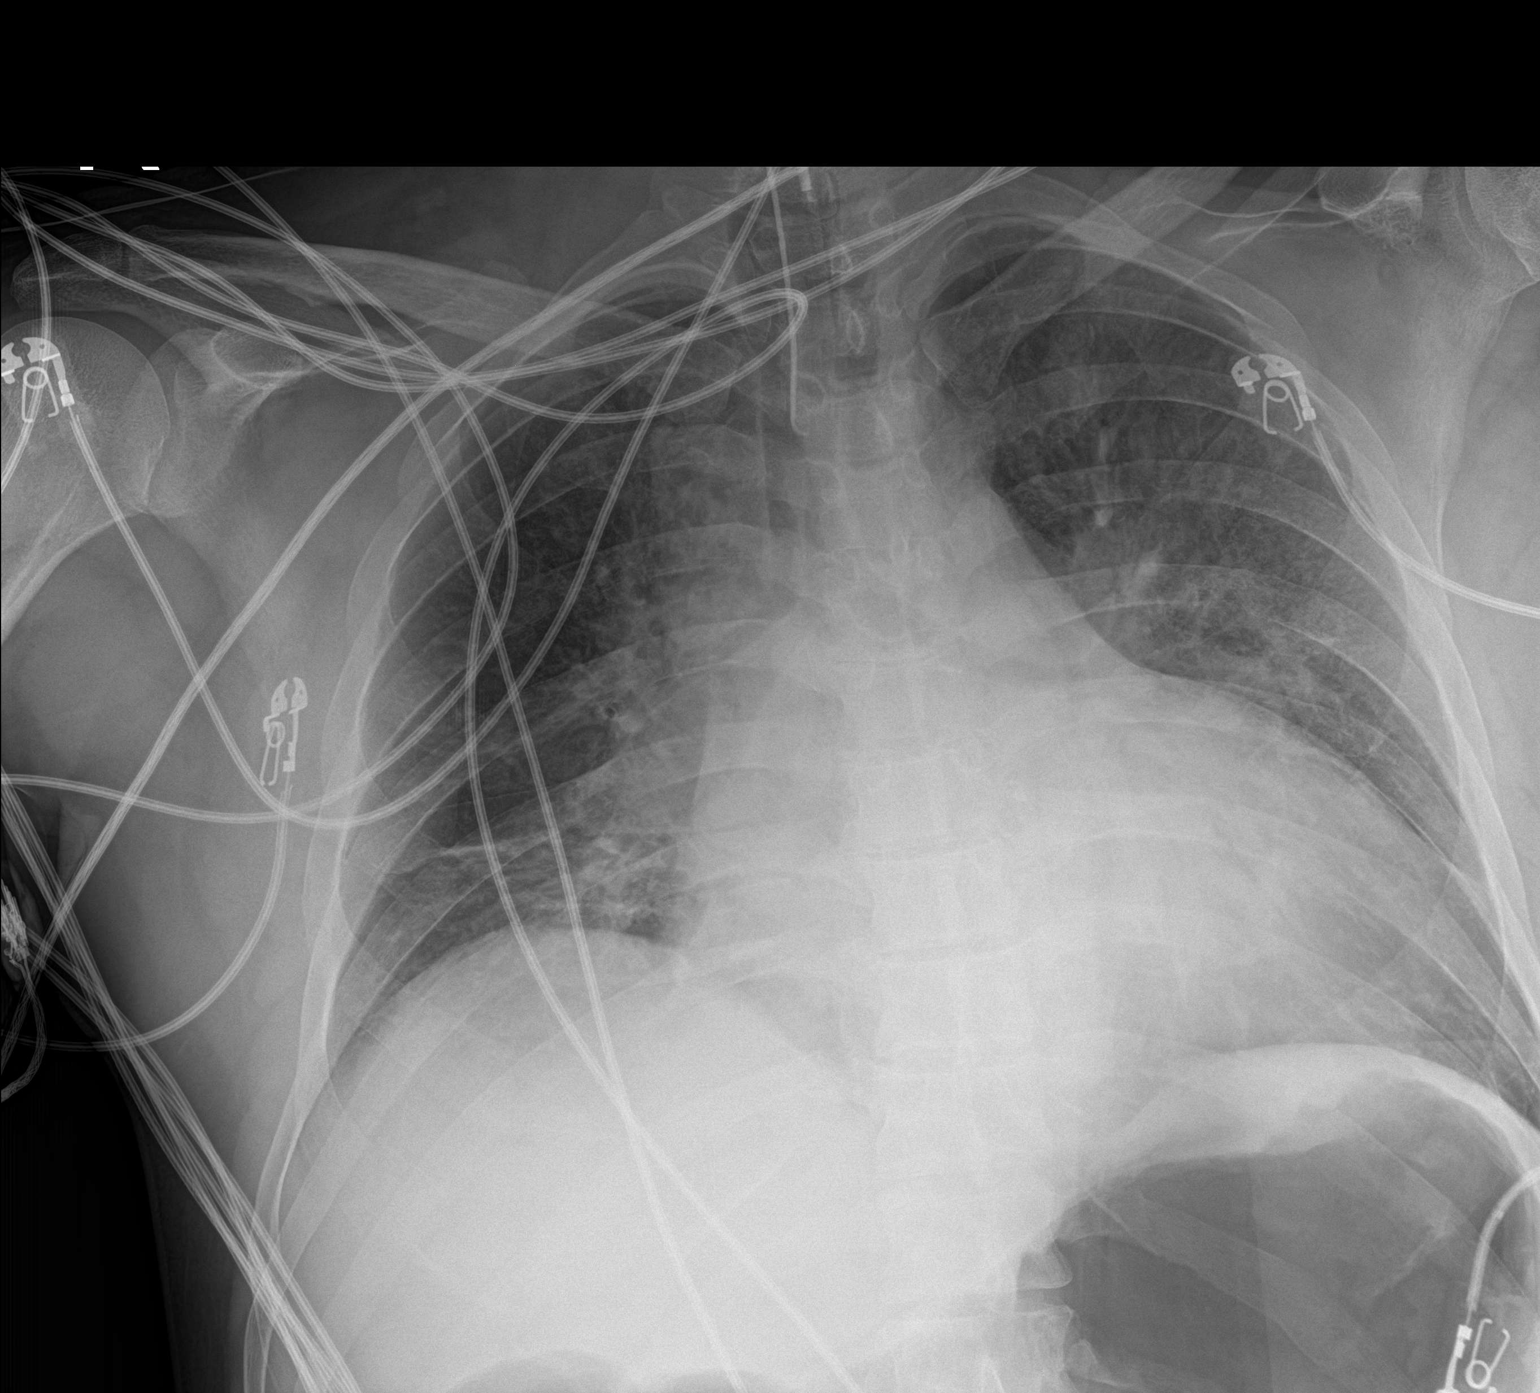

[1 of 1 positions shown; findings below may reference images not displayed]

FINDINGS: Endotracheal tube tip is about 2.9 cm superior to the carina.
Cardiomegaly with vascular congestion and mild diffuse interstitial
edema. Subsegmental atelectasis at the right base. No pneumothorax.
IMPRESSION: 1. Endotracheal tube tip about 2.9 cm superior to the carina
2. Cardiomegaly with vascular congestion and mild interstitial
edema.
